# Patient Record
Sex: Male | Born: 1965 | Race: White | Hispanic: No | Marital: Married | Smoking: Never smoker
Health system: Southern US, Community
[De-identification: ages and names within clinical notes are randomized; demographics above are authoritative.]

## PROBLEM LIST (undated history)

## (undated) DIAGNOSIS — J302 Other seasonal allergic rhinitis: Secondary | ICD-10-CM

## (undated) DIAGNOSIS — J029 Acute pharyngitis, unspecified: Secondary | ICD-10-CM

## (undated) HISTORY — PX: CHOLECYSTECTOMY: SHX55

## (undated) HISTORY — DX: Acute pharyngitis, unspecified: J02.9

---

## 2001-01-09 ENCOUNTER — Encounter (INDEPENDENT_AMBULATORY_CARE_PROVIDER_SITE_OTHER): Payer: Self-pay | Admitting: Specialist

## 2001-01-09 ENCOUNTER — Ambulatory Visit (HOSPITAL_BASED_OUTPATIENT_CLINIC_OR_DEPARTMENT_OTHER): Admission: RE | Admit: 2001-01-09 | Discharge: 2001-01-09 | Payer: Self-pay | Admitting: Surgery

## 2008-12-18 ENCOUNTER — Emergency Department (HOSPITAL_BASED_OUTPATIENT_CLINIC_OR_DEPARTMENT_OTHER): Admission: EM | Admit: 2008-12-18 | Discharge: 2008-12-18 | Payer: Self-pay | Admitting: Emergency Medicine

## 2008-12-18 ENCOUNTER — Ambulatory Visit: Payer: Self-pay | Admitting: Diagnostic Radiology

## 2010-09-21 ENCOUNTER — Ambulatory Visit: Payer: Self-pay | Admitting: Emergency Medicine

## 2010-11-08 NOTE — Assessment & Plan Note (Signed)
Summary: URI/TJ Room 5   Vital Signs:  Patient Profile:   45 Years Old Male CC:      Runny nose,congestion, sinus pressure x 4 days Height:     71 inches Weight:      215 pounds O2 Sat:      98 % O2 treatment:    Room Air Temp:     98.0 degrees F oral Pulse rate:   87 / minute Pulse rhythm:   regular Resp:     18 per minute BP sitting:   115 / 80  (left arm) Cuff size:   regular  Vitals Entered By: Emilio Math (September 21, 2010 12:09 PM)                  Current Allergies: No known allergies History of Present Illness History from: patient Chief Complaint: Runny nose,congestion, sinus pressure x 4 days History of Present Illness: Patient complains of onset of cold symptoms for 4 days.  They have been using Advil which is helping a little bit. No sore throat + cough No pleuritic pain No wheezing No nasal congestion + post-nasal drainage + sinus pain/pressure No chest congestion No itchy/red eyes No earache No hemoptysis No SOB No chills/sweats No fever No nausea No vomiting No abdominal pain No diarrhea No skin rashes No fatigue No myalgias No headache   REVIEW OF SYSTEMS Constitutional Symptoms      Denies fever, chills, night sweats, weight loss, weight gain, and fatigue.  Eyes       Denies change in vision, eye pain, eye discharge, glasses, contact lenses, and eye surgery. Ear/Nose/Throat/Mouth       Complains of frequent runny nose and sinus problems.      Denies hearing loss/aids, change in hearing, ear pain, ear discharge, dizziness, frequent nose bleeds, sore throat, hoarseness, and tooth pain or bleeding.  Respiratory       Complains of dry cough.      Denies productive cough, wheezing, shortness of breath, asthma, bronchitis, and emphysema/COPD.  Cardiovascular       Denies murmurs, chest pain, and tires easily with exhertion.    Gastrointestinal       Denies stomach pain, nausea/vomiting, diarrhea, constipation, blood in bowel movements,  and indigestion. Genitourniary       Denies painful urination, kidney stones, and loss of urinary control. Neurological       Denies paralysis, seizures, and fainting/blackouts. Musculoskeletal       Denies muscle pain, joint pain, joint stiffness, decreased range of motion, redness, swelling, muscle weakness, and gout.  Skin       Denies bruising, unusual mles/lumps or sores, and hair/skin or nail changes.  Psych       Denies mood changes, temper/anger issues, anxiety/stress, speech problems, depression, and sleep problems.  Past History:  Past Medical History: Unremarkable  Past Surgical History: Denies surgical history  Family History: Mother, Healthy Father, D  Social History: Non smoker ETOH yes No [employer] Insurance Physical Exam General appearance: well developed, well nourished, no acute distress Nasal: clear discharge Oral/Pharynx: pharyngeal erythema without exudate, uvula midline without deviation Neck: neck supple,  trachea midline, no masses Chest/Lungs: no rales, wheezes, or rhonchi bilateral, breath sounds equal without effort Heart: regular rate and  rhythm, no murmur Skin: no obvious rashes or lesions MSE: oriented to time, place, and person Assessment New Problems: UPPER RESPIRATORY INFECTION, ACUTE (ICD-465.9)   Patient Education: Patient and/or caregiver instructed in the following: rest, fluids.  Plan New  Medications/Changes: AMOXICILLIN 875 MG TABS (AMOXICILLIN) 1 by mouth two times a day for 7 days  #14 x 0, 09/21/2010, Hoyt Koch MD  New Orders: New Patient Level III [phone] Planning Comments:   1)  Take the prescribed antibiotic as instructed. (hold for a few days at least) 2)  Use nasal saline solution (over the counter) at least 3 times a day. 3)  Use over the counter decongestants like Zyrtec-D every 12 hours as needed to help with congestion. 4)  Can take tylenol every 6 hours or motrin every 8 hours for pain or fever. 5)   Follow up with your primary doctor  if no improvement in 5-7 days, sooner if increasing pain, fever, or new symptoms.     The patient and/or caregiver has been counseled thoroughly with regard to medications prescribed including dosage, schedule, interactions, rationale for use, and possible side effects and they verbalize understanding.  Diagnoses and expected course of recovery discussed and will return if not improved as expected or if the condition worsens. Patient and/or caregiver verbalized understanding.  Prescriptions: AMOXICILLIN 875 MG TABS (AMOXICILLIN) 1 by mouth two times a day for 7 days  #14 x 0   Entered and Authorized by:   Hoyt Koch MD   Signed by:   Hoyt Koch MD on 09/21/2010   Method used:   Print then Give to Patient   RxID:   [id number]   Orders Added: 1)  New Patient Level III [13086]

## 2010-11-22 ENCOUNTER — Other Ambulatory Visit (HOSPITAL_BASED_OUTPATIENT_CLINIC_OR_DEPARTMENT_OTHER): Payer: Self-pay | Admitting: Family Medicine

## 2010-11-22 ENCOUNTER — Ambulatory Visit (HOSPITAL_BASED_OUTPATIENT_CLINIC_OR_DEPARTMENT_OTHER)
Admission: RE | Admit: 2010-11-22 | Discharge: 2010-11-22 | Disposition: A | Discharge status: Home / Self Care | Payer: 59 | Source: Ambulatory Visit | Attending: Family Medicine | Admitting: Family Medicine

## 2010-11-22 DIAGNOSIS — R109 Unspecified abdominal pain: Secondary | ICD-10-CM

## 2010-11-22 MED ORDER — IOHEXOL 300 MG/ML  SOLN
100.0000 mL | Freq: Once | INTRAMUSCULAR | Status: AC | PRN
  Administered 2010-11-22: 100 mL via INTRAVENOUS

## 2010-11-27 ENCOUNTER — Other Ambulatory Visit (HOSPITAL_BASED_OUTPATIENT_CLINIC_OR_DEPARTMENT_OTHER): Payer: Self-pay | Admitting: Family Medicine

## 2010-11-27 DIAGNOSIS — R1011 Right upper quadrant pain: Secondary | ICD-10-CM

## 2010-11-29 ENCOUNTER — Other Ambulatory Visit (HOSPITAL_BASED_OUTPATIENT_CLINIC_OR_DEPARTMENT_OTHER): Payer: 59

## 2010-11-30 ENCOUNTER — Ambulatory Visit (HOSPITAL_BASED_OUTPATIENT_CLINIC_OR_DEPARTMENT_OTHER)
Admission: RE | Admit: 2010-11-30 | Discharge: 2010-11-30 | Disposition: A | Discharge status: Home / Self Care | Payer: 59 | Source: Ambulatory Visit | Attending: Family Medicine | Admitting: Family Medicine

## 2010-11-30 DIAGNOSIS — R1084 Generalized abdominal pain: Secondary | ICD-10-CM | POA: Insufficient documentation

## 2010-11-30 DIAGNOSIS — K7689 Other specified diseases of liver: Secondary | ICD-10-CM | POA: Insufficient documentation

## 2010-11-30 DIAGNOSIS — K8020 Calculus of gallbladder without cholecystitis without obstruction: Secondary | ICD-10-CM | POA: Insufficient documentation

## 2010-11-30 DIAGNOSIS — R1011 Right upper quadrant pain: Secondary | ICD-10-CM

## 2011-01-01 ENCOUNTER — Encounter (HOSPITAL_COMMUNITY)
Admission: RE | Admit: 2011-01-01 | Discharge: 2011-01-01 | Disposition: A | Discharge status: Home / Self Care | Payer: 59 | Source: Ambulatory Visit | Attending: Surgery | Admitting: Surgery

## 2011-01-01 LAB — CBC
HCT: 48.4 % (ref 39.0–52.0)
MCHC: 36.6 g/dL — ABNORMAL HIGH (ref 30.0–36.0)
MCV: 85.2 fL (ref 78.0–100.0)
Platelets: 138 10*3/uL — ABNORMAL LOW (ref 150–400)
RDW: 12.7 % (ref 11.5–15.5)

## 2011-01-07 ENCOUNTER — Ambulatory Visit (HOSPITAL_COMMUNITY): Payer: 59

## 2011-01-07 ENCOUNTER — Ambulatory Visit (HOSPITAL_COMMUNITY)
Admission: RE | Admit: 2011-01-07 | Discharge: 2011-01-07 | Disposition: A | Discharge status: Home / Self Care | Payer: 59 | Source: Ambulatory Visit | Attending: Surgery | Admitting: Surgery

## 2011-01-07 ENCOUNTER — Other Ambulatory Visit: Payer: Self-pay | Admitting: Surgery

## 2011-01-07 DIAGNOSIS — Z01812 Encounter for preprocedural laboratory examination: Secondary | ICD-10-CM | POA: Insufficient documentation

## 2011-01-07 DIAGNOSIS — K8010 Calculus of gallbladder with chronic cholecystitis without obstruction: Secondary | ICD-10-CM | POA: Insufficient documentation

## 2011-01-08 NOTE — Op Note (Signed)
NAMERJAY, REVOLORIO                ACCOUNT NO.:  [id number]  MEDICAL RECORD NO.:  [id number]           PATIENT TYPE:  O  LOCATION:  [facility]                         FACILITY:  [facility]  PHYSICIAN:  Abigail Miyamoto, M.D. DATE OF BIRTH:  August 15, 1966  DATE OF PROCEDURE:  01/07/2011 DATE OF DISCHARGE:  01/07/2011                              OPERATIVE REPORT   PREOPERATIVE DIAGNOSIS:  Symptomatic cholelithiasis.  POSTOPERATIVE DIAGNOSIS:  Symptomatic cholelithiasis.  PROCEDURE:  Laparoscopic cholecystectomy with intraoperative cholangiogram.  SURGEON:  Abigail Miyamoto, MD  ANESTHESIA:  General with 0.5% Marcaine.  ASSISTANT:  Magnus Ivan, RNFA  ESTIMATED BLOOD LOSS:  Minimal.  FINDINGS:  The patient was found to have a chronically scarred-appearing gallbladder with gallstones.  Cholangiogram was normal.  PROCEDURE IN DETAIL:  The patient was brought to the operative room, identified as Keith Bradford.  He was placed supine on the operative room table and general anesthesia was induced.  His abdomen was then prepped and draped in the usual sterile fashion.  Using a #15 blade, a small transverse incision was made across the lower edge of the umbilicus. This was carried down to the fascia which was then opened with a scalpel.  A hemostat was then used to pass the peritoneal cavity under direct vision.  Next, a 0 Vicryl purse-string suture was placed around the fascial opening.  The stump was placed in the opening and insufflation of the abdomen was begun.  A 5-mm port was then placed in the patient's epigastrium and 2 more in the right upper quadrant all under direct vision.  Gallbladder was then grasped and retracted above the liver bed.  Several adhesions to the gallbladder were then taken down.  The cystic duct was easily dissected out and critical window was achieved around it as well as a bridging lymphatic and the cystic artery.  I clipped the cystic duct once distally, I then  clipped the bridging lymphatic and the cystic artery proximally and distally.  I then made a small opening in the cystic duct with laparoscopic scissors. I made a small incision in the right upper quadrant and placed a cholangiocatheter through this incision under direct vision. Cholangiocatheter was then placed in the cystic duct.  I then performed a cholangiogram with contrast under direct fluoroscopy.  Contrast was seen to flow into the entire biliary system and duodenum without evidence of obstruction.  I then completely removed the cholangiocatheter.  The cystic duct was then clipped 3 times proximally and completely transected along with the lymphatic and the artery.  The gallbladder was then slowly dissected free from liver bed with electrocautery.  Once this freed from liver bed, it was removed through the incision at the umbilicus.  I then thoroughly irrigated the abdomen with normal saline.  A 0 Vicryl at the umbilicus was tied in place closing the fascial defect.  Hemostasis appeared to be achieved in the liver bed.  All ports were removed under direct vision.  The abdomen was deflated.  All incisions were anesthetized with Marcaine and closed with 4-0 Monocryl subcuticular sutures.  Steri-Strips and Band-Aids were then applied.  The patient  tolerated the procedure well. All counts were correct at the end of procedure.  The patient was then extubated in the operating room and taken in stable condition to recovery room.     Abigail Miyamoto, M.D.     DB/MEDQ  D:  01/07/2011  T:  01/07/2011  Job:  [id number]  Electronically Signed by Abigail Miyamoto M.D. on 01/08/2011 10:44:22 AM

## 2011-02-06 ENCOUNTER — Telehealth (INDEPENDENT_AMBULATORY_CARE_PROVIDER_SITE_OTHER): Payer: Self-pay | Admitting: Emergency Medicine

## 2011-02-06 ENCOUNTER — Encounter: Payer: Self-pay | Admitting: Emergency Medicine

## 2011-02-06 ENCOUNTER — Inpatient Hospital Stay (INDEPENDENT_AMBULATORY_CARE_PROVIDER_SITE_OTHER)
Admission: RE | Admit: 2011-02-06 | Discharge: 2011-02-06 | Disposition: A | Discharge status: Home / Self Care | Payer: 59 | Source: Ambulatory Visit | Attending: Emergency Medicine | Admitting: Emergency Medicine

## 2011-02-06 DIAGNOSIS — J019 Acute sinusitis, unspecified: Secondary | ICD-10-CM

## 2011-02-28 ENCOUNTER — Encounter: Payer: Self-pay | Admitting: Family Medicine

## 2011-02-28 ENCOUNTER — Ambulatory Visit
Admission: RE | Admit: 2011-02-28 | Discharge: 2011-02-28 | Disposition: A | Discharge status: Home / Self Care | Payer: 59 | Source: Ambulatory Visit | Attending: Family Medicine | Admitting: Family Medicine

## 2011-02-28 ENCOUNTER — Other Ambulatory Visit: Payer: Self-pay | Admitting: Family Medicine

## 2011-02-28 ENCOUNTER — Inpatient Hospital Stay (INDEPENDENT_AMBULATORY_CARE_PROVIDER_SITE_OTHER)
Admission: RE | Admit: 2011-02-28 | Discharge: 2011-02-28 | Disposition: A | Discharge status: Home / Self Care | Payer: 59 | Source: Ambulatory Visit | Attending: Family Medicine | Admitting: Family Medicine

## 2011-02-28 DIAGNOSIS — R0789 Other chest pain: Secondary | ICD-10-CM

## 2011-02-28 DIAGNOSIS — M940 Chondrocostal junction syndrome [Tietze]: Secondary | ICD-10-CM

## 2011-02-28 DIAGNOSIS — J309 Allergic rhinitis, unspecified: Secondary | ICD-10-CM | POA: Insufficient documentation

## 2011-03-01 ENCOUNTER — Encounter: Payer: Self-pay | Admitting: Family Medicine

## 2011-05-24 ENCOUNTER — Encounter: Payer: Self-pay | Admitting: Emergency Medicine

## 2011-05-24 ENCOUNTER — Inpatient Hospital Stay (INDEPENDENT_AMBULATORY_CARE_PROVIDER_SITE_OTHER)
Admission: RE | Admit: 2011-05-24 | Discharge: 2011-05-24 | Disposition: A | Discharge status: Home / Self Care | Payer: 59 | Source: Ambulatory Visit | Attending: Emergency Medicine | Admitting: Emergency Medicine

## 2011-05-24 DIAGNOSIS — J029 Acute pharyngitis, unspecified: Secondary | ICD-10-CM

## 2011-05-27 ENCOUNTER — Telehealth (INDEPENDENT_AMBULATORY_CARE_PROVIDER_SITE_OTHER): Payer: Self-pay | Admitting: *Deleted

## 2011-09-09 NOTE — Progress Notes (Signed)
Summary: CHEST PAIN (rm 2)   Vital Signs:  Patient Profile:   45 Years Old Male CC:      Chest Pain x 3 days Height:     71 inches Weight:      224 pounds O2 Sat:      96 % O2 treatment:    Room Air Temp:     98.6 degrees F oral Pulse rate:   89 / minute Resp:     16 per minute BP sitting:   132 / 91  (left arm) Cuff size:   regular  Pt. in pain?   yes    Location:   center chest    Intensity:   5    Type:       tight  Vitals Entered By: Lajean Saver RN (Feb 28, 2011 10:27 AM)                   Updated Prior Medication List: ADVIL 200 MG TABS (IBUPROFEN) 3 prn ALLEGRA ALLERGY 180 MG TABS (FEXOFENADINE HCL)   Current Allergies: No known allergies History of Present Illness Chief Complaint: Chest Pain x 3 days History of Present Illness:  Subjective:  Patient complains of 4 day history of constant vague discomfort over anterior chest and sternum, somewhat worse with movement.  The pain does not radiate.  No cough or shortness of breath.  No reflux symptoms.  No recent trauma to chest or change in physical activities.  No fevers, chills, and sweats.  He reports that he had a respiratory illness two weeks ago, resolved.  No family history of cardiovascular disease.  REVIEW OF SYSTEMS Constitutional Symptoms      Denies fever, chills, night sweats, weight loss, weight gain, and fatigue.  Eyes       Denies change in vision, eye pain, eye discharge, glasses, contact lenses, and eye surgery. Ear/Nose/Throat/Mouth       Denies hearing loss/aids, change in hearing, ear pain, ear discharge, dizziness, frequent runny nose, frequent nose bleeds, sinus problems, sore throat, hoarseness, and tooth pain or bleeding.  Respiratory       Denies dry cough, productive cough, wheezing, shortness of breath, asthma, bronchitis, and emphysema/COPD.  Cardiovascular       Complains of chest pain.      Denies murmurs and tires easily with exhertion.      Comments: "tight"    Gastrointestinal       Denies stomach pain, nausea/vomiting, diarrhea, constipation, blood in bowel movements, and indigestion. Genitourniary       Denies painful urination, blood or discharge from penis, kidney stones, and loss of urinary control. Neurological       Denies paralysis, seizures, and fainting/blackouts. Musculoskeletal       Denies muscle pain, joint pain, joint stiffness, decreased range of motion, redness, swelling, muscle weakness, and gout.  Skin       Denies bruising, unusual mles/lumps or sores, and hair/skin or nail changes.  Psych       Denies mood changes, temper/anger issues, anxiety/stress, speech problems, depression, and sleep problems. Other Comments: Patient c/o "tight" CP located in center of chest constant x 3 days. No SOB, nausea, dizziness, numbness.    Past History:  Past Medical History:  Allergic rhinitis  Past Surgical History: Reviewed history from 09/21/2010 and no changes required. Denies surgical history  Social History: Non smoker ETOH 2-3/month yes Drug use-no Married Drug Use:  no   Objective:  Appearance:  Patient appears healthy, stated age,  and in no acute distress  Eyes:  Pupils are equal, round, and reactive to light and accomodation.  Extraocular movement is intact.  Conjunctivae are not inflamed.  Ears:  Canals normal.  Tympanic membranes normal.   Nose:  Mildly congested turbinates.  No sinus tenderness  Pharynx:  Normal  Neck:  Supple.  No adenopathy is present.  No thyromegaly is present  Lungs:  Clear to auscultation.  Breath sounds are equal.  Chest:  Tenderness over sternum with resisted flexion of pectoralis muscles bilaterally Heart:  Regular rate and rhythm without murmurs, rubs, or gallops.  Abdomen:  Nontender without masses or hepatosplenomegaly.  Bowel sounds are present.  No CVA or flank tenderness.  Extremities:  No edema.   Skin:  No rash  EKG:  normal Chest X-ray:  negative Assessment New  Problems: COSTOCHONDRITIS, ACUTE (ICD-733.6) CHEST PAIN, NON-CARDIAC (ICD-786.59) ALLERGIC RHINITIS (ICD-477.9)  SUSPECT COSTOCHONDRITIS SECONDARY TO RECENT RESPIRATORY ILLNESS  Plan New Orders: T-Chest x-ray, 2 views [71020] EKG w/ Interpretation [93000] Pulse Oximetry (single measurment) [94760] Est. Patient Level IV [16109] Planning Comments:   Begin Ibuprofen 200mg , 4 tabs every 8 hours with food.  Apply heating pad to chest twice daily.  Given a Water quality scientist patient information and instruction sheet on topic  Followup with [facility] if not improved in two weeks.   The patient and/or caregiver has been counseled thoroughly with regard to medications prescribed including dosage, schedule, interactions, rationale for use, and possible side effects and they verbalize understanding.  Diagnoses and expected course of recovery discussed and will return if not improved as expected or if the condition worsens. Patient and/or caregiver verbalized understanding.   Orders Added: 1)  T-Chest x-ray, 2 views [71020] 2)  EKG w/ Interpretation [93000] 3)  Pulse Oximetry (single measurment) [94760] 4)  Est. Patient Level IV [60454]

## 2011-09-09 NOTE — Telephone Encounter (Signed)
  Phone Note Outgoing Call   Call placed by: Clemens Catholic LPN,  May 27, 2011 5:12 PM Call placed to: Patient Summary of Call: call back: left message with throat culture results and to call back if he has any questions or concerns. Initial call taken by: Clemens Catholic LPN,  May 27, 2011 5:13 PM

## 2011-09-09 NOTE — Progress Notes (Signed)
Summary: ?cold/TM   Vital Signs:  Patient Profile:   45 Years Old Male CC:      Cold & URI symptoms Height:     71 inches Weight:      228 pounds O2 Sat:      97 % O2 treatment:    Room Air Temp:     100.2 degrees F oral Pulse rate:   95 / minute Resp:     20 per minute BP sitting:   101 / 68  (left arm) Cuff size:   regular  Vitals Entered By: Clemens Catholic LPN (May 24, 2011 4:18 PM)                  Updated Prior Medication List: No Medications Current Allergies (reviewed today): No known allergies History of Present Illness History from: patient Chief Complaint: Cold & URI symptoms History of Present Illness: 45 Years Old Male complains of onset of cold symptoms for 2 days.  His wife was also sick.  He just got home from a cruise, so sick contacts probable. + sore throat +/- cough No pleuritic pain No wheezing + nasal congestion + post-nasal drainage + sinus pain/pressure No chest congestion No itchy/red eyes No earache No hemoptysis No SOB No chills/sweats + fever No nausea No vomiting No abdominal pain No diarrhea No skin rashes + fatigue + myalgias + headache   REVIEW OF SYSTEMS Constitutional Symptoms       Complains of fever, chills, and night sweats.     Denies weight loss, weight gain, and fatigue.  Eyes       Denies change in vision, eye pain, eye discharge, glasses, contact lenses, and eye surgery. Ear/Nose/Throat/Mouth       Complains of frequent runny nose, sinus problems, sore throat, and hoarseness.      Denies hearing loss/aids, change in hearing, ear pain, ear discharge, dizziness, frequent nose bleeds, and tooth pain or bleeding.  Respiratory       Complains of dry cough.      Denies productive cough, wheezing, shortness of breath, asthma, bronchitis, and emphysema/COPD.  Cardiovascular       Denies murmurs, chest pain, and tires easily with exhertion.    Gastrointestinal       Denies stomach pain, nausea/vomiting, diarrhea,  constipation, blood in bowel movements, and indigestion. Genitourniary       Denies painful urination, kidney stones, and loss of urinary control. Neurological       Complains of headaches.      Denies paralysis, seizures, and fainting/blackouts. Musculoskeletal       Complains of muscle pain and joint pain.      Denies joint stiffness, decreased range of motion, redness, swelling, muscle weakness, and gout.  Skin       Denies bruising, unusual mles/lumps or sores, and hair/skin or nail changes.  Psych       Denies mood changes, temper/anger issues, anxiety/stress, speech problems, depression, and sleep problems. Other Comments: pt c/o head congestion, hoarseness, fever, and sore throat x 2 days. he has taken advil and decongestant.    Past History:  Past Medical History: Reviewed history from 02/28/2011 and no changes required.  Allergic rhinitis  Past Surgical History:  Cholecystectomy  Family History: Reviewed history from 09/21/2010 and no changes required. Mother, Healthy Father, D  Social History: Reviewed history from 02/28/2011 and no changes required. Non smoker ETOH 2-3/month yes Drug use-no Married Physical Exam General appearance: well developed, well nourished, no acute distress,  feels warm Ears: cerumen bilaterally Nasal: clear discharge Oral/Pharynx: pharyngeal erythema without exudate, uvula midline without deviation Neck: ant cerv LAD nontender Chest/Lungs: no rales, wheezes, or rhonchi bilateral, breath sounds equal without effort Heart: regular rate and  rhythm, no murmur MSE: oriented to time, place, and person Assessment New Problems: ACUTE PHARYNGITIS (ICD-462)   Plan New Medications/Changes: PREDNISONE 20 MG TABS (PREDNISONE) two times a day for 3 days  #6 x 0, 05/24/2011, Hoyt Koch MD AMOXICILLIN 875 MG TABS (AMOXICILLIN) 1 by mouth two times a day for 7 days  #14 x 0, 05/24/2011, Hoyt Koch MD  New Orders: Est. Patient  Level III [14782] Rapid Strep [95621] T-Culture, Throat [30865-78469] Planning Comments:   1)  Take the prescribed antibiotic as instructed.  Rx for pred x3d for the symptoms.  Rapid strep neg, but culture is pending. 2)  Use nasal saline solution (over the counter) at least 3 times a day. 3)  Use over the counter decongestants like Zyrtec-D every 12 hours as needed to help with congestion. 4)  Can take tylenol every 6 hours or motrin every 8 hours for pain or fever. 5)  Follow up with your primary doctor  if no improvement in 5-7 days, sooner if increasing pain, fever, or new symptoms.    The patient and/or caregiver has been counseled thoroughly with regard to medications prescribed including dosage, schedule, interactions, rationale for use, and possible side effects and they verbalize understanding.  Diagnoses and expected course of recovery discussed and will return if not improved as expected or if the condition worsens. Patient and/or caregiver verbalized understanding.  Prescriptions: PREDNISONE 20 MG TABS (PREDNISONE) two times a day for 3 days  #6 x 0   Entered and Authorized by:   Hoyt Koch MD   Signed by:   Hoyt Koch MD on 05/24/2011   Method used:   Print then Give to Patient   RxID:   [id number] AMOXICILLIN 875 MG TABS (AMOXICILLIN) 1 by mouth two times a day for 7 days  #14 x 0   Entered and Authorized by:   Hoyt Koch MD   Signed by:   Hoyt Koch MD on 05/24/2011   Method used:   Print then Give to Patient   RxID:   [id number]   Orders Added: 1)  Est. Patient Level III [56387] 2)  Rapid Strep [56433] 3)  T-Culture, Throat [[id number]]    Laboratory Results  Date/Time Received: May 24, 2011 5:11 PM  Date/Time Reported: May 24, 2011 5:11 PM   Other Tests  Rapid Strep: negative  Kit Test Internal QC: Negative   (Normal Range: Negative)

## 2011-09-09 NOTE — Telephone Encounter (Signed)
  Phone Note Outgoing Call   Call placed by: Emilio Math,  Feb 06, 2011 5:04 PM Call placed to: Pharmacy Summary of Call: Called in Bactrim DS 1 tab by mouth two times a day x10 days #20 no refills to [facility]. Emilio Math  Feb 06, 2011 5:06 PM

## 2011-09-09 NOTE — Progress Notes (Signed)
Summary: SINUS ISSUES Room 5   Vital Signs:  Patient Profile:   45 Years Old Male CC:      Ears hurt, throat hurts, pressure x 2 days Height:     71 inches O2 Sat:      99 % O2 treatment:    Room Air Temp:     98.6 degrees F oral Pulse rate:   100 / minute Pulse rhythm:   regular Resp:     16 per minute BP sitting:   120 / 79  (left arm) Cuff size:   regular  Vitals Entered By: Emilio Math (Feb 06, 2011 4:48 PM)                  Current Allergies: No known allergies History of Present Illness History from: patient Chief Complaint: Ears hurt, throat hurts, pressure x 2 days History of Present Illness: 45 Years Old Male complains of onset of cold symptoms for 2-3 days.  Keith Bradford has been using Allegra, Singulair, and Sudafed which is helping a little bit. No sore throat + cough No pleuritic pain No wheezing + nasal congestion + post-nasal drainage + sinus pain/pressure No chest congestion No itchy/red eyes No earache No hemoptysis No SOB No chills/sweats No fever No nausea No vomiting No abdominal pain No diarrhea No skin rashes No fatigue No myalgias + headache   Current Meds ADVIL 200 MG TABS (IBUPROFEN) 3 prn ALLEGRA ALLERGY 180 MG TABS (FEXOFENADINE HCL)  SINGULAIR 10 MG TABS (MONTELUKAST SODIUM)  BACTRIM DS 800-160 MG TABS (SULFAMETHOXAZOLE-TRIMETHOPRIM) 1 by mouth two times a day for 10 days  REVIEW OF SYSTEMS  Eyes       Complains of eye drainage. Ear/Nose/Throat/Mouth       Complains of sinus problems and sore throat.      Neurological       Complains of headaches.    Past History:  Past Medical History: Reviewed history from 09/21/2010 and no changes required. Unremarkable  Past Surgical History: Reviewed history from 09/21/2010 and no changes required. Denies surgical history  Social History: Reviewed history from 09/21/2010 and no changes required. Non smoker ETOH yes No [employer] Insurance Physical Exam General appearance:  well developed, well nourished, no acute distress Ears: normal, no lesions or deformities Nasal: mucosa pink, nonedematous, no septal deviation, turbinates normal Oral/Pharynx: tongue normal, posterior pharynx without erythema or exudate Chest/Lungs: no rales, wheezes, or rhonchi bilateral, breath sounds equal without effort Heart: regular rate and  rhythm, no murmur MSE: oriented to time, place, and person Assessment New Problems: SINUSITIS, ACUTE (ICD-461.9)   Plan New Medications/Changes: BACTRIM DS 800-160 MG TABS (SULFAMETHOXAZOLE-TRIMETHOPRIM) 1 by mouth two times a day for 10 days  #20 x 0, 02/06/2011, Hoyt Koch MD  New Orders: Pulse Oximetry (single measurment) [94760] Est. Patient Level IV [13086] Planning Comments:   1)  Take the prescribed antibiotic as instructed.   2)  Use nasal saline solution (over the counter) at least 3 times a day. 3)  Use over the counter decongestants like Zyrtec-D every 12 hours as needed to help with congestion. 4)  Can take tylenol every 6 hours or motrin every 8 hours for pain or fever. 5)  Follow up with your primary doctor  if no improvement in 5-7 days, sooner if increasing pain, fever, or new symptoms.    The patient and/or caregiver has been counseled thoroughly with regard to medications prescribed including dosage, schedule, interactions, rationale for use, and possible side effects and they  verbalize understanding.  Diagnoses and expected course of recovery discussed and will return if not improved as expected or if the condition worsens. Patient and/or caregiver verbalized understanding.  Prescriptions: BACTRIM DS 800-160 MG TABS (SULFAMETHOXAZOLE-TRIMETHOPRIM) 1 by mouth two times a day for 10 days  #20 x 0   Entered and Authorized by:   Hoyt Koch MD   Signed by:   Hoyt Koch MD on 02/06/2011   Method used:   Telephoned to ...       [facility]  [address] [phone][facility] (retail)       [address] [place], [place]  [address]       Ph: [phone] or [phone]       Fax: [phone]   RxID:   [id number]   Orders Added: 1)  Pulse Oximetry (single measurment) [94760] 2)  Est. Patient Level IV [36644]

## 2011-10-28 IMAGING — CT CT ABD-PELV W/ CM
2 of 5 series · 16 of 46 positions shown, 18 images · IV contrast (APPLIED)
Comparison: None.

CLINICAL DATA: Generalized intermittent and abdominal pain for 1
year. No history of surgery, injury or cancer.

CT ABDOMEN AND PELVIS WITH CONTRAST
TECHNIQUE: Multidetector CT imaging of the abdomen and pelvis was
performed following the standard protocol during bolus
administration of intravenous contrast.
Contrast: 100 ml of Omnipaque 300% and oral contrast

[Series 3: abd/pelvis 1.5 b31f thins · axial · 0.75mm/px · z∈[-514,-44]mm · 13 of 728 slices shown, 15 images]
[im 28/728  soft-tissue]
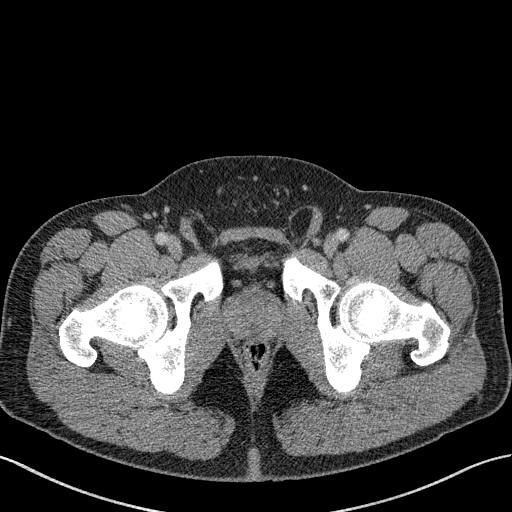
[im 28/728  bone]
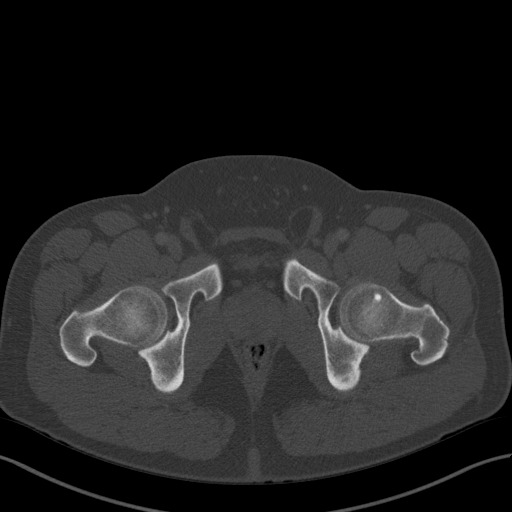
[im 84/728  soft-tissue]
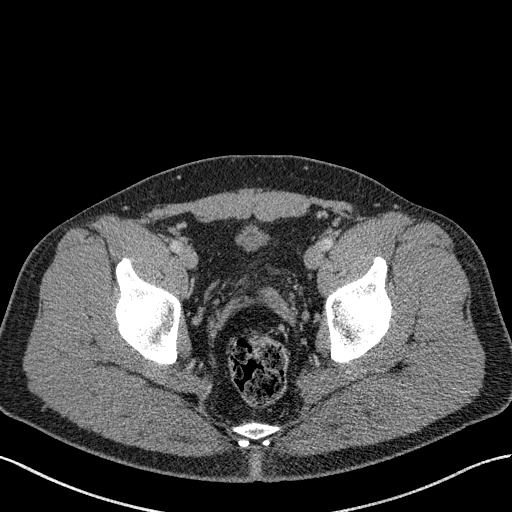
[im 140/728  soft-tissue]
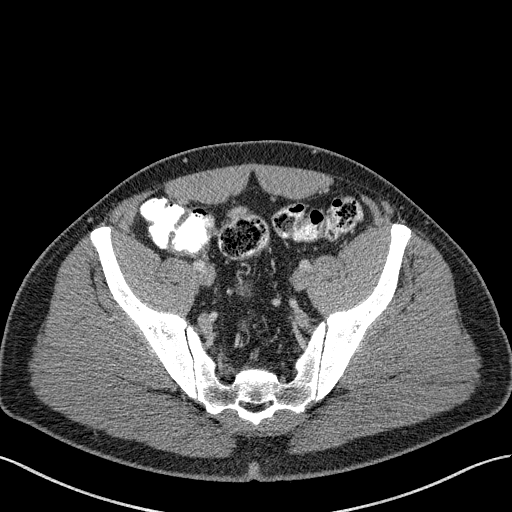
[im 196/728  soft-tissue]
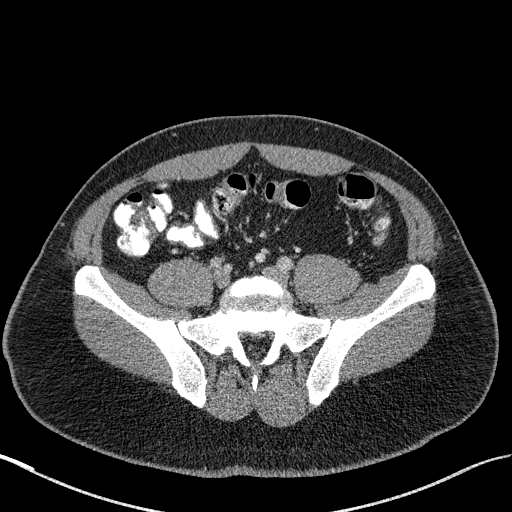
[im 252/728  soft-tissue]
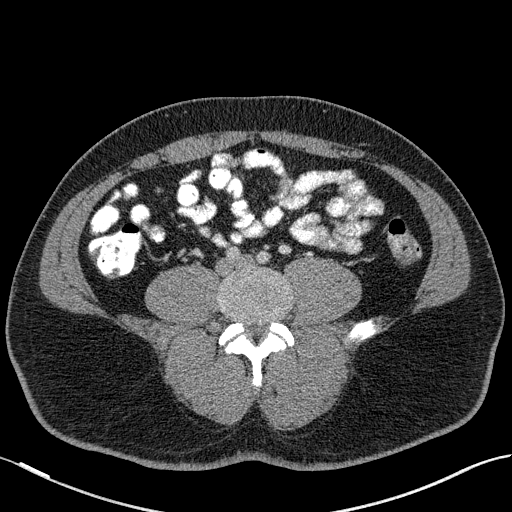
[im 308/728  soft-tissue]
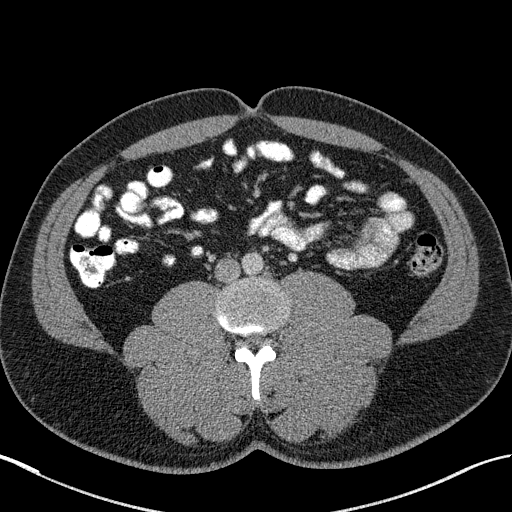
[im 364/728  soft-tissue]
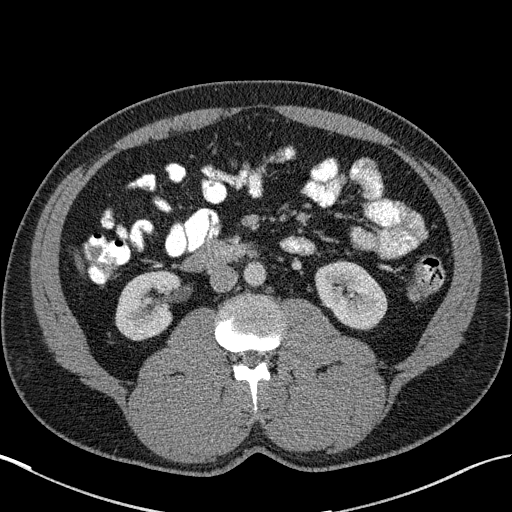
[im 420/728  soft-tissue]
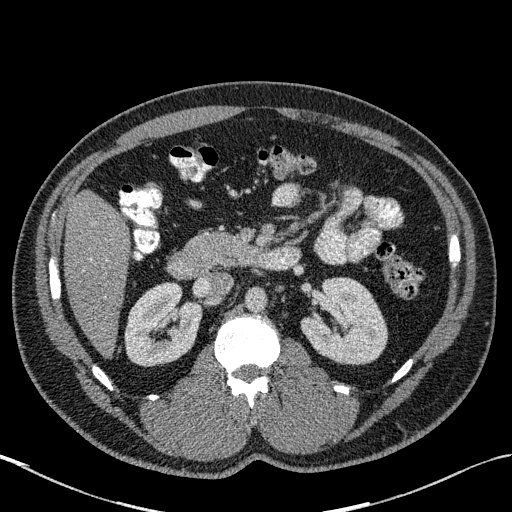
[im 476/728  soft-tissue]
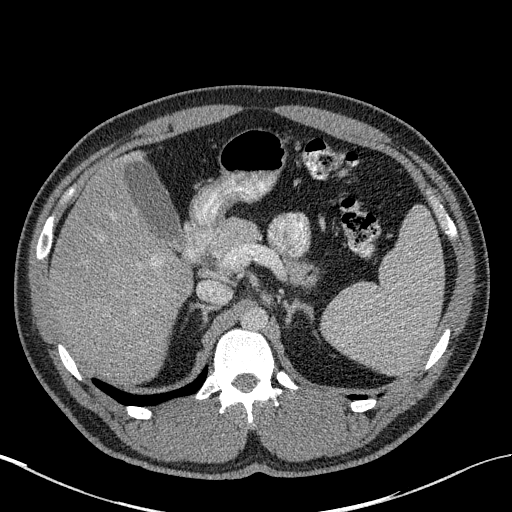
[im 476/728  bone]
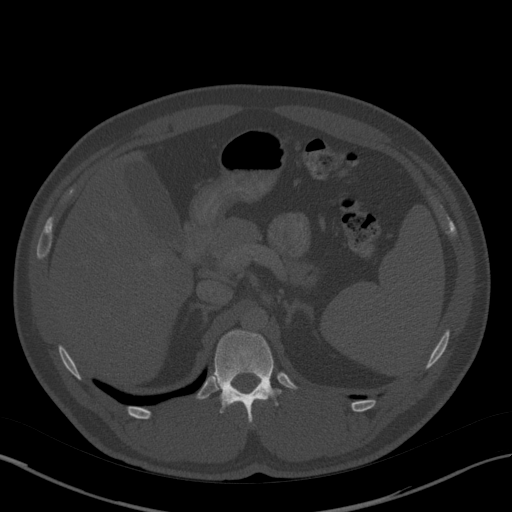
[im 532/728  soft-tissue]
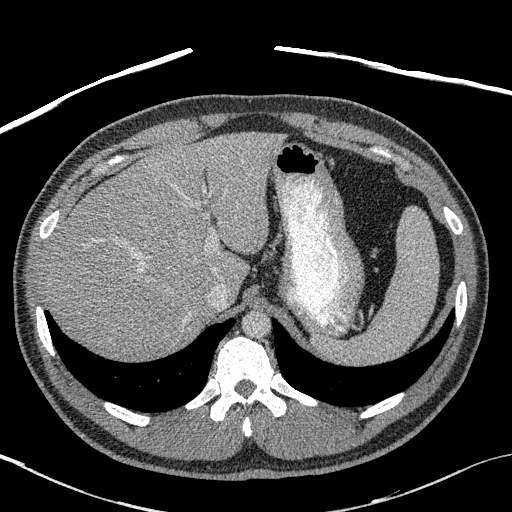
[im 588/728  soft-tissue]
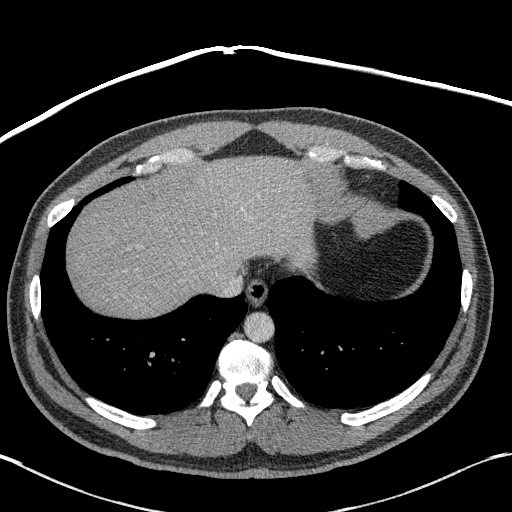
[im 644/728  soft-tissue]
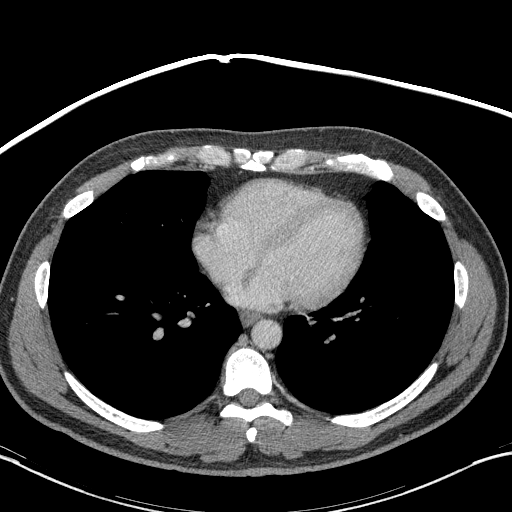
[im 700/728  soft-tissue]
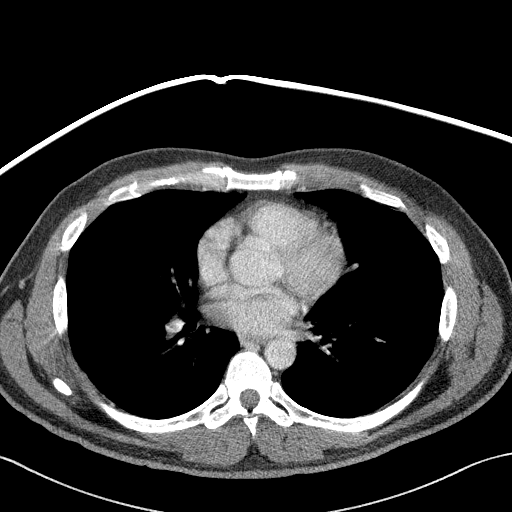

[Series 5: abd/pelvis 3.0 coronal · coronal · 0.77mm/px · 3 of 100 slices shown]
[im 34/100  soft-tissue]
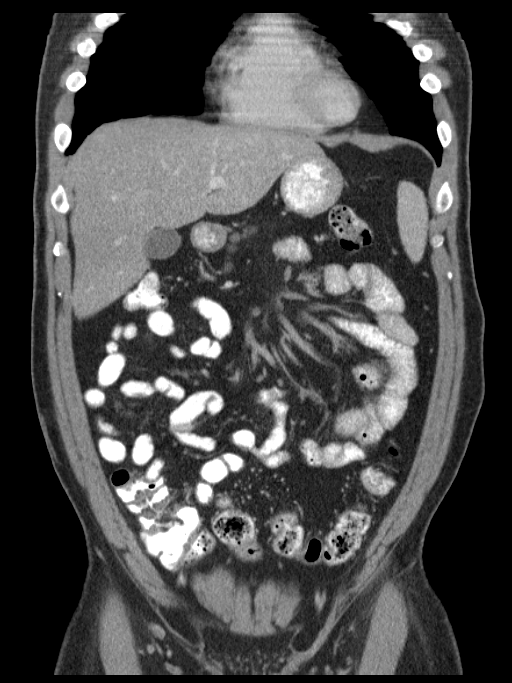
[im 45/100  soft-tissue]
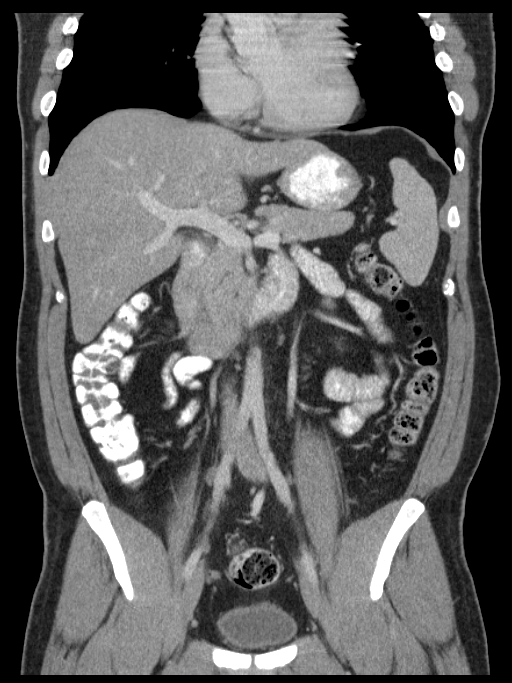
[im 56/100  soft-tissue]
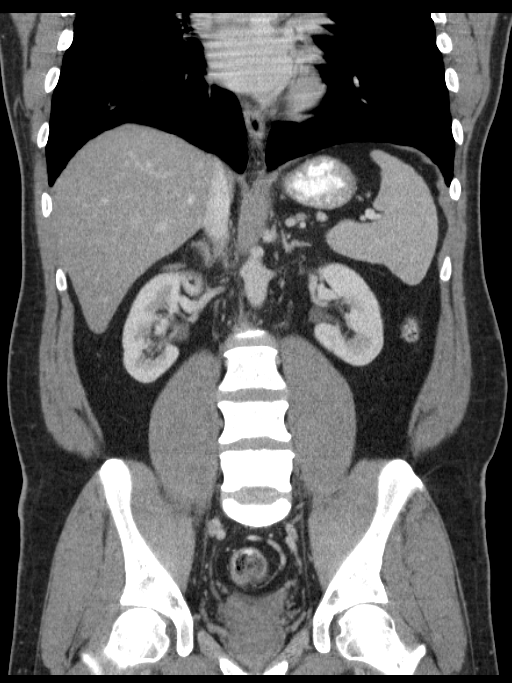

[16 of 46 positions shown; findings below may reference images not displayed]

FINDINGS: The lung bases appear clear.

The liver, spleen, adrenal glands and pancreas all have an
unremarkable post contrasted appearance.  There is a 9 mm high
attenuation focus identified in the gallbladder neck which may be
the result of volume averaging from the takeoff of the gallbladder
and common bile duct but as it is seen in several planes it is
suspicious for a gallstone.

Good bowel contrast was achieved.  No focal bowel abnormalities are
identified.  The ileocecal valve and appendix have a normal
appearance.

The prostate, vas deferens and bladder appear within normal limits.
No intra-abdominal or pelvic fluid or inflammatory change is seen.
No pathologic adenopathy is noted.

Bone windows demonstrate mild degenerative changes of the lower
thoracic spine.  No worrisome bone lesions are seen.

Vascular structures are unremarkable.  Small bilateral fat
containing inguinal hernias are seen.  Extraabdominal soft tissue
planes are otherwise maintained.
IMPRESSION: Question gallstone in the gallbladder neck.  This can be further
evaluated with ultrasound.

Small bilateral fat containing inguinal hernias.

Otherwise unremarkable abdominal pelvic CT.

## 2011-11-11 ENCOUNTER — Encounter: Payer: Self-pay | Admitting: *Deleted

## 2011-11-11 ENCOUNTER — Emergency Department
Admission: EM | Admit: 2011-11-11 | Discharge: 2011-11-11 | Disposition: A | Discharge status: Home / Self Care | Payer: Self-pay | Source: Home / Self Care | Attending: Emergency Medicine | Admitting: Emergency Medicine

## 2011-11-11 DIAGNOSIS — H5789 Other specified disorders of eye and adnexa: Secondary | ICD-10-CM

## 2011-11-11 MED ORDER — POLYMYXIN B-TRIMETHOPRIM 10000-0.1 UNIT/ML-% OP SOLN: 1.0000 [drp] | Freq: Four times a day (QID) | OPHTHALMIC | Status: AC

## 2011-11-11 NOTE — ED Notes (Signed)
Pt c/o RT eye lid swelling x 3 days, no injury. Denies vision changes. No OTC meds.

## 2011-11-11 NOTE — ED Provider Notes (Signed)
History     CSN: [id number]  Arrival date & time 11/11/11  4098   First MD Initiated Contact with Patient 11/11/11 [phone]      No chief complaint on file.   (Consider location/radiation/quality/duration/timing/severity/associated sxs/prior treatment) HPI Keith Bradford presents today with an EYE COMPLAINT.  Swelling over his R eye.  No actual eye involvement.  Mild, not affecting vision.    Location: right  Onset: 3  Days   Symptoms: Redness: no Discharge: no Pain: no Photophobia: no Decreased Vision: no URI symptoms: no Itching/Allergy sxs: yes Glaucoma: no Recent eye surgery: no Contact lens use: no  Red Flags Trauma: no Foreign Body: no Vomiting/HA: no Halos around lights: no Chickenpox or zoster: no    No past medical history on file.  No past surgical history on file.  No family history on file.  History  Substance Use Topics  . Smoking status: Not on file  . Smokeless tobacco: Not on file  . Alcohol Use: Not on file      Review of Systems  Allergies  Review of patient's allergies indicates not on file.  Home Medications  No current outpatient prescriptions on file.  There were no vitals taken for this visit.  Physical Exam  Eyes: Conjunctivae and EOM are normal. Pupils are equal, round, and reactive to light. No foreign bodies found. Right eye exhibits no discharge, no exudate and no hordeolum. No foreign body present in the right eye. No scleral icterus.      ED Course  Procedures (including critical care time)  Labs Reviewed - No data to display No results found.   No diagnosis found.    MDM   The differential diagnosis includes conjunctivitis, early stye, allergic reaction, backed up tear gland.  I gave him a prescription for Polytrim to use for the next week. I've also encouraged him to use some cold compresses in the morning time for swelling. I also let him know that warm compresses sometimes can help with a stye.  Lily Kocher, MD 11/11/11 [phone]

## 2012-02-03 IMAGING — CR DG CHEST 2V
2 series · 2 of 2 positions shown · non-contrast
Comparison: None

CLINICAL DATA: Chest pain, pain over sternum.

CHEST - 2 VIEW

[view not recorded (1 of 2)]
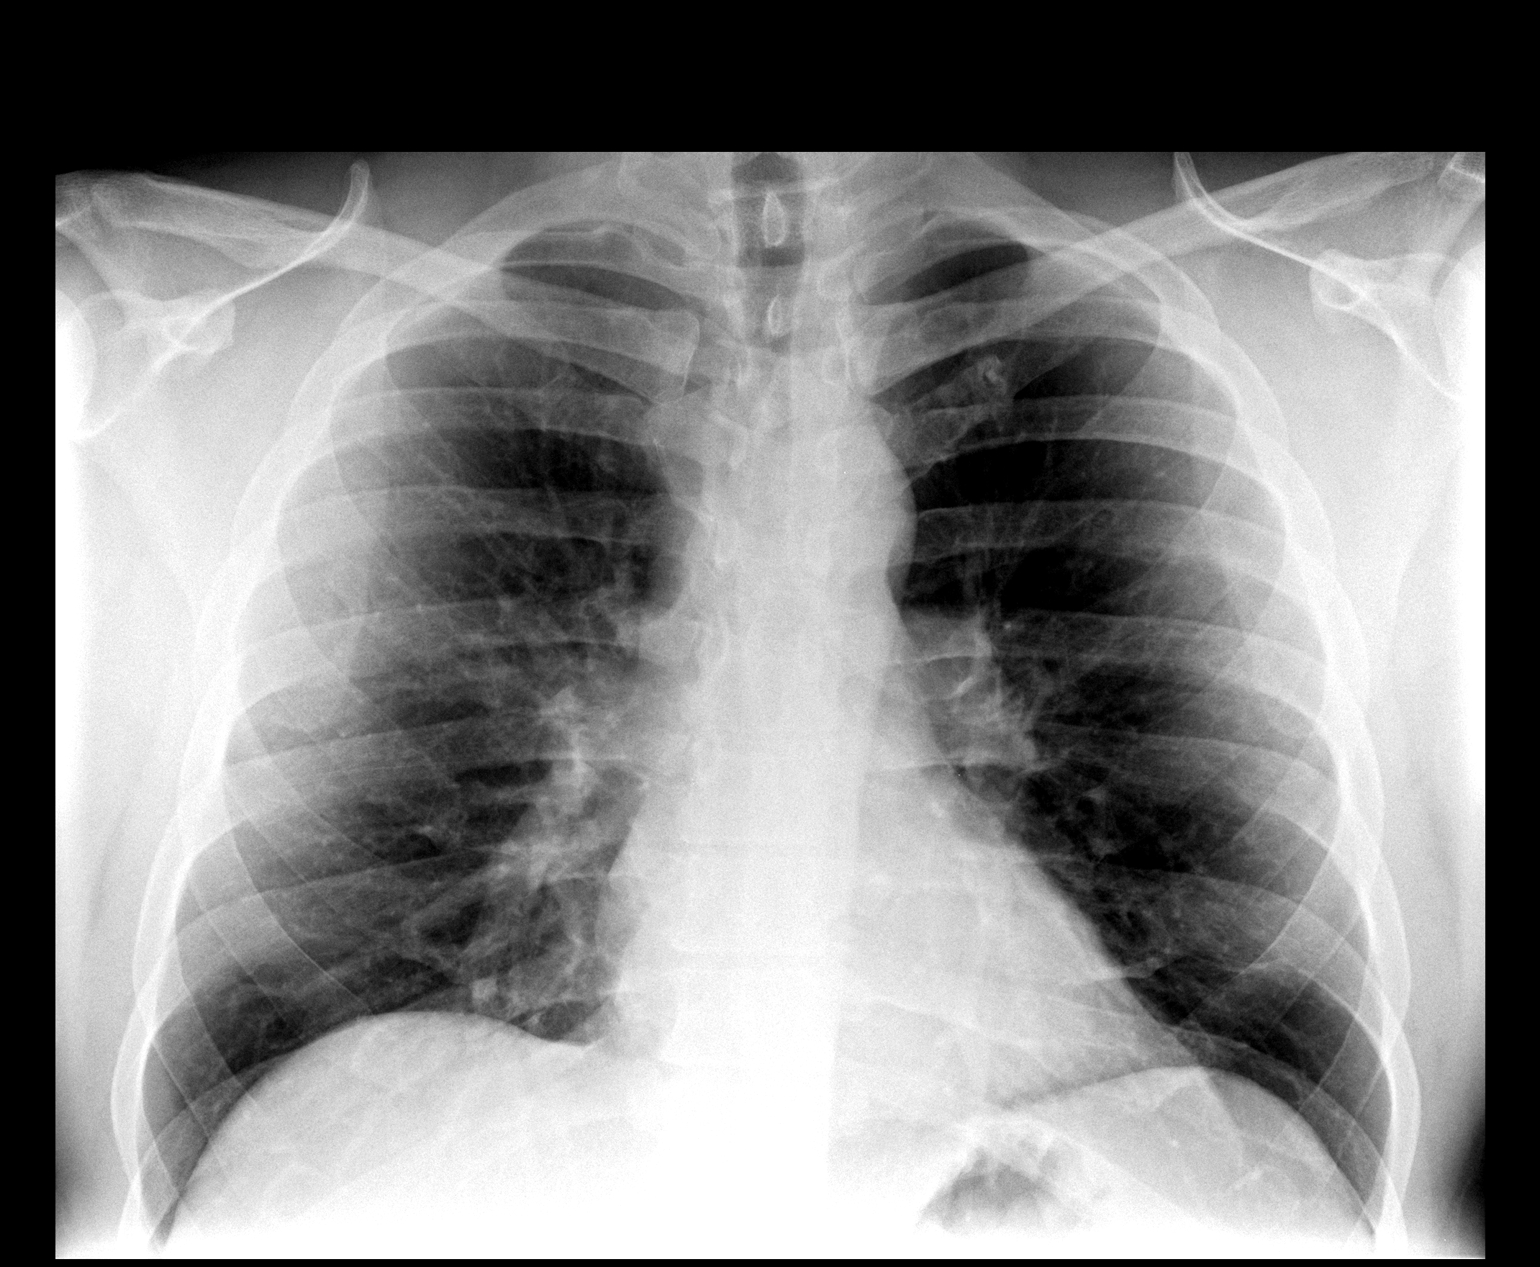

[view not recorded (2 of 2)]
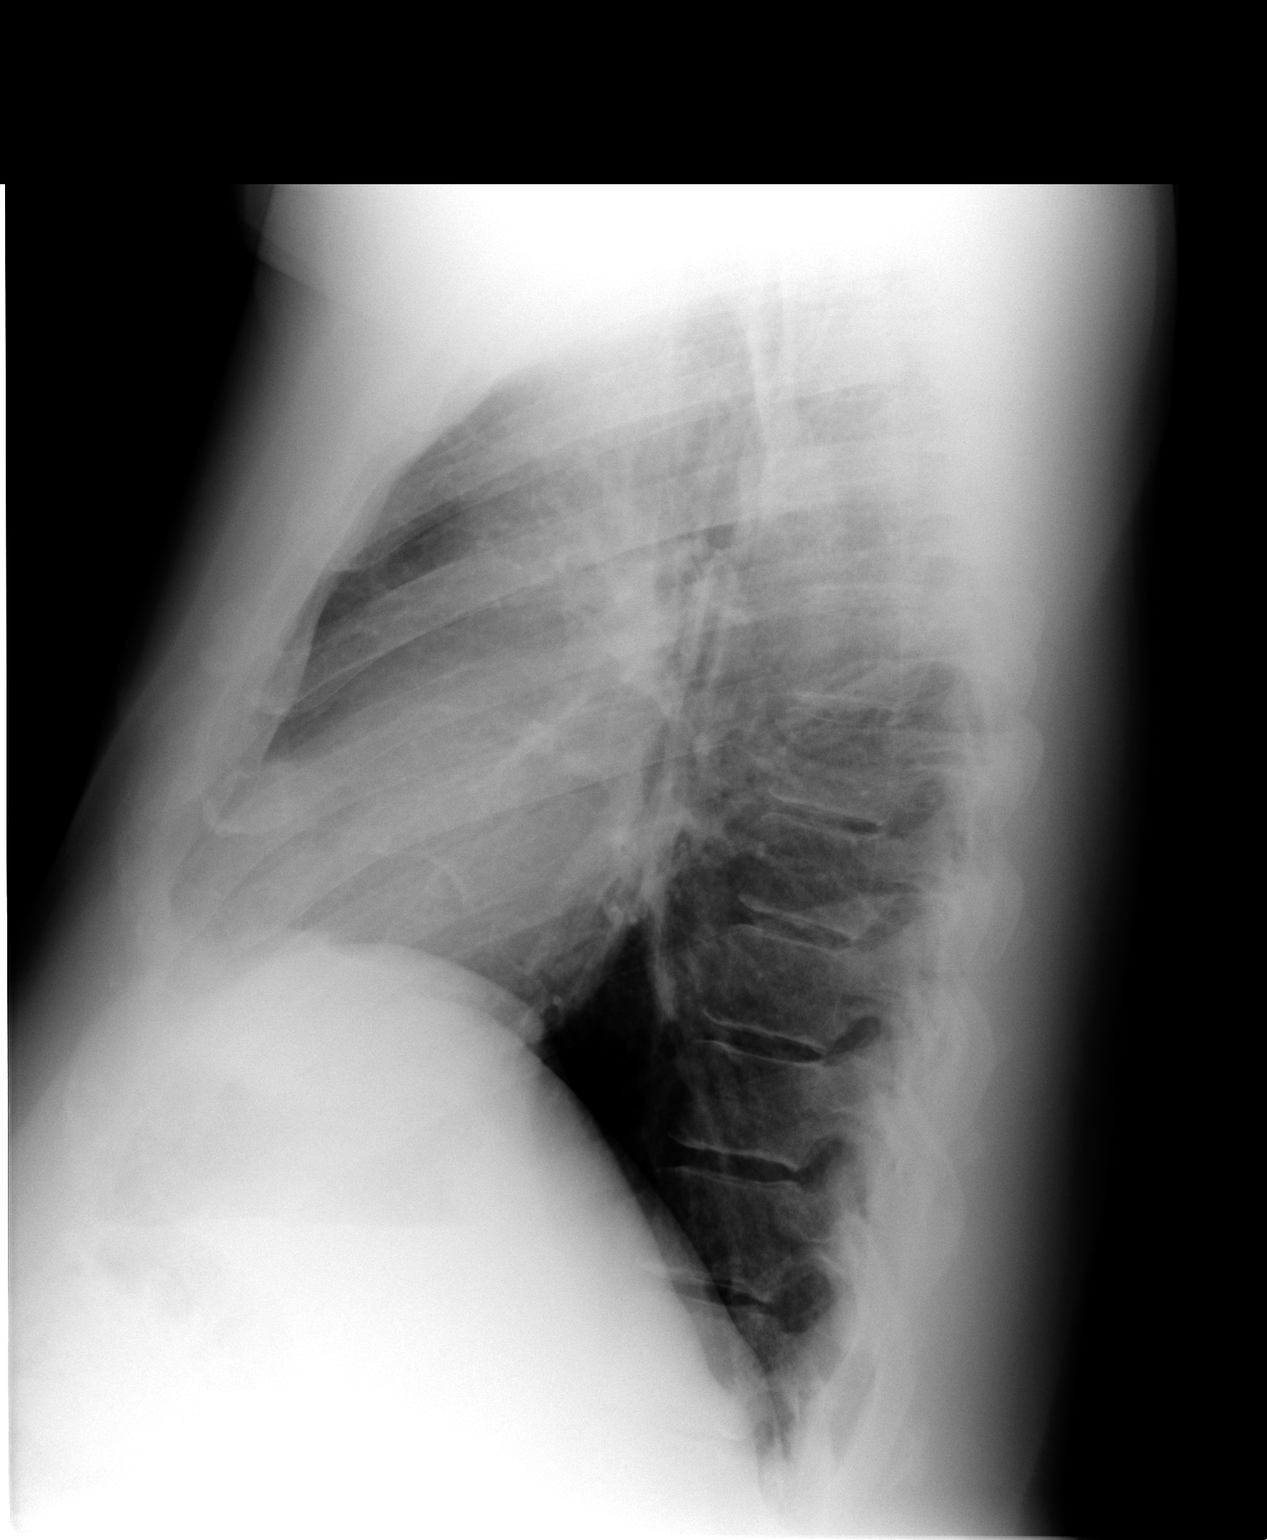

[2 of 2 positions shown; findings below may reference images not displayed]

FINDINGS: Heart and mediastinal contours are within normal limits.
No focal opacities or effusions.  No acute bony abnormality.
IMPRESSION: No active disease.

## 2012-03-25 ENCOUNTER — Emergency Department
Admission: EM | Admit: 2012-03-25 | Discharge: 2012-03-25 | Disposition: A | Discharge status: Home / Self Care | Payer: 59 | Source: Home / Self Care | Attending: Emergency Medicine | Admitting: Emergency Medicine

## 2012-03-25 ENCOUNTER — Encounter: Payer: Self-pay | Admitting: *Deleted

## 2012-03-25 DIAGNOSIS — J029 Acute pharyngitis, unspecified: Secondary | ICD-10-CM

## 2012-03-25 DIAGNOSIS — J069 Acute upper respiratory infection, unspecified: Secondary | ICD-10-CM

## 2012-03-25 HISTORY — DX: Other seasonal allergic rhinitis: J30.2

## 2012-03-25 MED ORDER — AZITHROMYCIN 250 MG PO TABS: ORAL_TABLET | ORAL | Status: AC

## 2012-03-25 MED ORDER — GUAIFENESIN-CODEINE 100-10 MG/5ML PO SYRP: 5.0000 mL | ORAL_SOLUTION | Freq: Four times a day (QID) | ORAL | Status: AC | PRN

## 2012-03-25 NOTE — ED Notes (Signed)
Pt c/o sore throat, head and chest congestion, and cough x 5 days. Denies fever. He has taken decongestants and advil with no relief.

## 2012-03-25 NOTE — ED Provider Notes (Addendum)
History     CSN: [id number]  Arrival date & time 03/25/12  4098   First MD Initiated Contact with Patient 03/25/12 [phone]      Chief Complaint  Patient presents with  . Sore Throat    (Consider location/radiation/quality/duration/timing/severity/associated sxs/prior treatment) HPI Keith Bradford is a 46 y.o. male who complains of onset of cold symptoms for 5 days.  The symptoms are constant and mild-moderate in severity.  Taking Advil which is helping a little.  No known sick contacts. + sore throat + cough (worsening) No pleuritic pain No wheezing + nasal congestion + post-nasal drainage + sinus pain/pressure No chest congestion No itchy/red eyes No earache No hemoptysis No SOB No chills/sweats No fever No nausea No vomiting No abdominal pain No diarrhea No skin rashes No fatigue No myalgias No headache    Past Medical History  Diagnosis Date  . Seasonal allergies     Past Surgical History  Procedure Date  . Cholecystectomy     History reviewed. No pertinent family history.  History  Substance Use Topics  . Smoking status: Never Smoker   . Smokeless tobacco: Not on file  . Alcohol Use: Yes      Review of Systems  All other systems reviewed and are negative.    Allergies  Review of patient's allergies indicates no known allergies.  Home Medications   Current Outpatient Rx  Name Route Sig Dispense Refill  . AZITHROMYCIN 250 MG PO TABS  Use as directed 1 each 0  . FEXOFENADINE HCL 180 MG PO TABS Oral Take 180 mg by mouth as needed.    . GUAIFENESIN-CODEINE 100-10 MG/5ML PO SYRP Oral Take 5 mLs by mouth 4 (four) times daily as needed for cough or congestion. 120 mL 0    BP 113/73  Pulse 76  Temp 98.4 F (36.9 C) (Oral)  Resp 18  Ht 5\' 11"  (1.803 m)  Wt 209 lb 8 oz (95.029 kg)  BMI 29.22 kg/m2  SpO2 99%  Physical Exam  Nursing note and vitals reviewed. Constitutional: He is oriented to person, place, and time. He appears well-developed and  well-nourished.  HENT:  Head: Normocephalic and atraumatic.  Right Ear: Tympanic membrane, external ear and ear canal normal.  Left Ear: Tympanic membrane, external ear and ear canal normal.  Nose: Mucosal edema present.  Mouth/Throat: Posterior oropharyngeal erythema (minimal) present. No oropharyngeal exudate or posterior oropharyngeal edema.       Cerumen bilaterally  Eyes: No scleral icterus.  Neck: Neck supple.  Cardiovascular: Regular rhythm and normal heart sounds.   Pulmonary/Chest: Effort normal and breath sounds normal. No respiratory distress. He has no decreased breath sounds. He has no wheezes. He has no rhonchi.  Neurological: He is alert and oriented to person, place, and time.  Skin: Skin is warm and dry.  Psychiatric: He has a normal mood and affect. His speech is normal.    ED Course  Procedures (including critical care time)   Labs Reviewed  POCT RAPID STREP A (OFFICE)   No results found.   1. Acute upper respiratory infections of unspecified site   2. Acute pharyngitis       MDM  1)  Take the prescribed antibiotic as instructed.  Hold for a few days since likely only viral.  Rx for cough syrup given to take. 2)  Use nasal saline solution (over the counter) at least 3 times a day. 3)  Use over the counter decongestants like Zyrtec-D every 12 hours as needed  to help with congestion.  If you have hypertension, do not take medicines with sudafed.  4)  Can take tylenol every 6 hours or motrin every 8 hours for pain or fever. 5)  Follow up with your primary doctor if no improvement in 5-7 days, sooner if increasing pain, fever, or new symptoms.      Marlaine Hind, MD 03/25/12 [phone]    Rapid strep negative.  No culture.  Marlaine Hind, MD 03/25/12 [phone]

## 2013-02-28 ENCOUNTER — Emergency Department
Admission: EM | Admit: 2013-02-28 | Discharge: 2013-02-28 | Disposition: A | Discharge status: Home / Self Care | Payer: 59 | Source: Home / Self Care | Attending: Family Medicine | Admitting: Family Medicine

## 2013-02-28 DIAGNOSIS — J309 Allergic rhinitis, unspecified: Secondary | ICD-10-CM

## 2013-02-28 DIAGNOSIS — J069 Acute upper respiratory infection, unspecified: Secondary | ICD-10-CM

## 2013-02-28 NOTE — ED Notes (Signed)
Keith Bradford complains of dry cough, sneezing, fevers, headaches, sore throat and runny nose for 2 days.

## 2013-02-28 NOTE — ED Provider Notes (Signed)
History     CSN: [id number]  Arrival date & time 02/28/13  1334   First MD Initiated Contact with Patient 02/28/13 1342      Chief Complaint  Patient presents with  . Cough    x 2 days  . Sore Throat    x 2 days  . sneezing     x 2 days   HPI  URI Symptoms Onset: 2 days  Description: rhinorrhea, nasal congestion, sinus pressure  Modifying factors:  Baseline allergies   Symptoms Nasal discharge: yes Fever: no Sore throat: no Cough: yes Wheezing: no Ear pain: no GI symptoms: no Sick contacts: no  Red Flags  Stiff neck: no Dyspnea: no Rash: no Swallowing difficulty: no  Sinusitis Risk Factors Headache/face pain: no Double sickening: no tooth pain: no  Allergy Risk Factors Sneezing: yes Itchy scratchy throat: yes Seasonal symptoms: yes  Flu Risk Factors Headache: no muscle aches: no severe fatigue: no   Past Medical History  Diagnosis Date  . Seasonal allergies     Past Surgical History  Procedure Laterality Date  . Cholecystectomy      Family History  Problem Relation Age of Onset  . Cancer Father     Throat    History  Substance Use Topics  . Smoking status: Never Smoker   . Smokeless tobacco: Never Used  . Alcohol Use: Yes      Review of Systems  All other systems reviewed and are negative.    Allergies  Review of patient's allergies indicates no known allergies.  Home Medications   Current Outpatient Rx  Name  Route  Sig  Dispense  Refill  . fexofenadine (ALLEGRA) 180 MG tablet   Oral   Take 180 mg by mouth as needed.           BP 127/83  Pulse 97  Temp(Src) 99 F (37.2 C) (Oral)  Ht 5\' 11"  (1.803 m)  Wt 228 lb (103.42 kg)  BMI 31.81 kg/m2  SpO2 96%  Physical Exam  Constitutional: He appears well-developed and well-nourished.  HENT:  Head: Normocephalic and atraumatic.  Right Ear: External ear normal.  Left Ear: External ear normal.  +nasal erythema, rhinorrhea bilaterally, + post oropharyngeal  erythema    Eyes: Pupils are equal, round, and reactive to light.  Neck: Normal range of motion. Neck supple.  Cardiovascular: Normal rate and regular rhythm.   Pulmonary/Chest: Effort normal and breath sounds normal.  Abdominal: Soft. Bowel sounds are normal.  Musculoskeletal: Normal range of motion.  Lymphadenopathy:    He has no cervical adenopathy.  Neurological: He is alert.  Skin: Skin is warm.    ED Course  Procedures (including critical care time)  Labs Reviewed - No data to display No results found.   1. URI (upper respiratory infection)   2. Allergic rhinitis       MDM  Overlapping viral and allergic sxs.  Discussed supportive care.  Restart nasal steroid and antihistamine.  Discussed infectious red flags including SOB.  Follow up as needed.      The patient and/or caregiver has been counseled thoroughly with regard to treatment plan and/or medications prescribed including dosage, schedule, interactions, rationale for use, and possible side effects and they verbalize understanding. Diagnoses and expected course of recovery discussed and will return if not improved as expected or if the condition worsens. Patient and/or caregiver verbalized understanding.             Doree Albee, MD 02/28/13 1352

## 2014-04-14 ENCOUNTER — Encounter (HOSPITAL_BASED_OUTPATIENT_CLINIC_OR_DEPARTMENT_OTHER): Payer: Self-pay | Admitting: Emergency Medicine

## 2014-04-14 ENCOUNTER — Emergency Department (HOSPITAL_BASED_OUTPATIENT_CLINIC_OR_DEPARTMENT_OTHER)
Admission: EM | Admit: 2014-04-14 | Discharge: 2014-04-14 | Disposition: A | Discharge status: Home / Self Care | Payer: 59 | Attending: Emergency Medicine | Admitting: Emergency Medicine

## 2014-04-14 ENCOUNTER — Emergency Department (HOSPITAL_BASED_OUTPATIENT_CLINIC_OR_DEPARTMENT_OTHER): Payer: 59

## 2014-04-14 DIAGNOSIS — R9431 Abnormal electrocardiogram [ECG] [EKG]: Secondary | ICD-10-CM | POA: Insufficient documentation

## 2014-04-14 DIAGNOSIS — Z79899 Other long term (current) drug therapy: Secondary | ICD-10-CM | POA: Insufficient documentation

## 2014-04-14 DIAGNOSIS — R079 Chest pain, unspecified: Secondary | ICD-10-CM | POA: Insufficient documentation

## 2014-04-14 LAB — CBC WITH DIFFERENTIAL/PLATELET
BASOS ABS: 0 10*3/uL (ref 0.0–0.1)
BASOS PCT: 0 % (ref 0–1)
Eosinophils Absolute: 0.1 10*3/uL (ref 0.0–0.7)
Eosinophils Relative: 3 % (ref 0–5)
HCT: 46.7 % (ref 39.0–52.0)
Hemoglobin: 16.7 g/dL (ref 13.0–17.0)
LYMPHS PCT: 33 % (ref 12–46)
Lymphs Abs: 1.5 10*3/uL (ref 0.7–4.0)
MCH: 30.8 pg (ref 26.0–34.0)
MCHC: 35.8 g/dL (ref 30.0–36.0)
MCV: 86 fL (ref 78.0–100.0)
Monocytes Absolute: 0.4 10*3/uL (ref 0.1–1.0)
Monocytes Relative: 9 % (ref 3–12)
NEUTROS ABS: 2.6 10*3/uL (ref 1.7–7.7)
NEUTROS PCT: 55 % (ref 43–77)
PLATELETS: 140 10*3/uL — AB (ref 150–400)
RBC: 5.43 MIL/uL (ref 4.22–5.81)
RDW: 13.1 % (ref 11.5–15.5)
WBC: 4.7 10*3/uL (ref 4.0–10.5)

## 2014-04-14 LAB — TROPONIN I: Troponin I: <0.3 ng/mL (ref <0.30)

## 2014-04-14 LAB — BASIC METABOLIC PANEL
ANION GAP: 13 (ref 5–15)
BUN: 17 mg/dL (ref 6–23)
CHLORIDE: 102 meq/L (ref 96–112)
CO2: 27 meq/L (ref 19–32)
Calcium: 9.6 mg/dL (ref 8.4–10.5)
Creatinine, Ser: 1.1 mg/dL (ref 0.50–1.35)
GFR calc non Af Amer: 79 mL/min — ABNORMAL LOW (ref >90)
Glucose, Bld: 106 mg/dL — ABNORMAL HIGH (ref 70–99)
Potassium: 3.9 mEq/L (ref 3.7–5.3)
SODIUM: 142 meq/L (ref 137–147)

## 2014-04-14 MED ORDER — ASPIRIN 81 MG PO CHEW
324.0000 mg | CHEWABLE_TABLET | Freq: Once | ORAL | Status: AC
  Administered 2014-04-14: 324 mg via ORAL
  Filled 2014-04-14: qty 4

## 2014-04-14 NOTE — ED Notes (Signed)
Patient transported to X-ray 

## 2014-04-14 NOTE — ED Notes (Signed)
Pt having left sided chest pain.  No other symptoms.  Started prior to lunch.

## 2014-04-14 NOTE — Discharge Instructions (Signed)
Call for a follow up appointment with a Family or Primary Care Provider.  Call a cardiologist for further evaluation of your chest pain and abnormal EKG/ECG findings. Return if Symptoms worsen.   Take medication as prescribed.

## 2014-04-14 NOTE — ED Provider Notes (Signed)
CSN: [id number]     Arrival date & time 04/14/14  1312 History   First MD Initiated Contact with Patient 04/14/14 1356     Chief Complaint  Patient presents with  . Chest Pain     (Consider location/radiation/quality/duration/timing/severity/associated sxs/prior Treatment) HPI Comments: The patient is a 48 year old male without significant past medical history presenting to emergency room chief complaint of left-sided chest pain with an abrupt onset approximately 11:45. The patient describes the left-sided discomfort as nonradiating, chest tightness, mild, onset after eating. The patient states discomfort self resolved approximately one hour ago, denying chest pain in the emergency department. Denies similar symptoms in the past. Denies aggravating or relieving factors, denies taking anything for discomfort.  The patient denies cough, dyspnea, leg swelling, palpitations, pleurisy.  Denies history of murmur, previous MI, arrythmia, or family history of early MI.  NO previous stress test. PCP: [facility]  The history is provided by the patient. No language interpreter was used.    Past Medical History  Diagnosis Date  . Seasonal allergies    Past Surgical History  Procedure Laterality Date  . Cholecystectomy     Family History  Problem Relation Age of Onset  . Cancer Father     Throat   History  Substance Use Topics  . Smoking status: Never Smoker   . Smokeless tobacco: Never Used  . Alcohol Use: Yes    Review of Systems  Constitutional: Negative for fever and chills.  Respiratory: Positive for chest tightness. Negative for cough and shortness of breath.   Cardiovascular: Positive for chest pain. Negative for palpitations and leg swelling.  Gastrointestinal: Negative for nausea, vomiting and abdominal pain.      Allergies  Review of patient's allergies indicates no known allergies.  Home Medications   Prior to Admission medications   Medication Sig Start Date End Date  Taking? Authorizing Provider  fexofenadine (ALLEGRA) 180 MG tablet Take 180 mg by mouth as needed.    Historical Provider, MD   BP 125/86  Pulse 76  Temp(Src) 98.7 F (37.1 C) (Oral)  Resp 16  SpO2 100% Physical Exam  Nursing note and vitals reviewed. Constitutional: He is oriented to person, place, and time. He appears well-developed and well-nourished.  Non-toxic appearance. He does not have a sickly appearance. He does not appear ill. No distress.  HENT:  Head: Normocephalic and atraumatic.  Mouth/Throat: Oropharynx is clear and moist.  Eyes: EOM are normal. Pupils are equal, round, and reactive to light. No scleral icterus.  Neck: Normal range of motion. Neck supple.  Cardiovascular: Normal rate and regular rhythm.   No lower extremity edema  Pulmonary/Chest: Effort normal and breath sounds normal. No respiratory distress. He has no wheezes. He has no rales. He exhibits no tenderness.  No rash to anterior chest  Abdominal: Soft. He exhibits no distension. There is no tenderness. There is no rebound, no guarding and no CVA tenderness.  Musculoskeletal: Normal range of motion.  Neurological: He is alert and oriented to person, place, and time.  Skin: Skin is warm and dry. He is not diaphoretic.  Psychiatric: He has a normal mood and affect. His behavior is normal.    ED Course  Procedures (including critical care time) Labs Review Results for orders placed during the hospital encounter of 04/14/14  CBC WITH DIFFERENTIAL      Result Value Ref Range   WBC 4.7  4.0 - 10.5 K/uL   RBC 5.43  4.22 - 5.81 MIL/uL   Hemoglobin  16.7  13.0 - 17.0 g/dL   HCT 46.7  39.0 - 52.0 %   MCV 86.0  78.0 - 100.0 fL   MCH 30.8  26.0 - 34.0 pg   MCHC 35.8  30.0 - 36.0 g/dL   RDW 13.1  11.5 - 15.5 %   Platelets 140 (*) 150 - 400 K/uL   Neutrophils Relative % 55  43 - 77 %   Neutro Abs 2.6  1.7 - 7.7 K/uL   Lymphocytes Relative 33  12 - 46 %   Lymphs Abs 1.5  0.7 - 4.0 K/uL   Monocytes Relative  9  3 - 12 %   Monocytes Absolute 0.4  0.1 - 1.0 K/uL   Eosinophils Relative 3  0 - 5 %   Eosinophils Absolute 0.1  0.0 - 0.7 K/uL   Basophils Relative 0  0 - 1 %   Basophils Absolute 0.0  0.0 - 0.1 K/uL  BASIC METABOLIC PANEL      Result Value Ref Range   Sodium 142  137 - 147 mEq/L   Potassium 3.9  3.7 - 5.3 mEq/L   Chloride 102  96 - 112 mEq/L   CO2 27  19 - 32 mEq/L   Glucose, Bld 106 (*) 70 - 99 mg/dL   BUN 17  6 - 23 mg/dL   Creatinine, Ser 1.10  0.50 - 1.35 mg/dL   Calcium 9.6  8.4 - 10.5 mg/dL   GFR calc non Af Amer 79 (*) >90 mL/min   GFR calc Af Amer >90  >90 mL/min   Anion gap 13  5 - 15  TROPONIN I      Result Value Ref Range   Troponin I <0.30  <0.30 ng/mL  TROPONIN I      Result Value Ref Range   Troponin I <0.30  <0.30 ng/mL   Dg Chest 2 View  04/14/2014   CLINICAL DATA:  Chest pain.  EXAM: CHEST  2 VIEW  COMPARISON:  PA and lateral chest 02/28/2011.  FINDINGS: Lungs are clear. Heart size is normal. No pneumothorax or pleural effusion. No bony abnormality is identified.  IMPRESSION: Negative chest.   Electronically Signed   By: Inge Rise M.D.   On: 04/14/2014 14:14    Date: 04/14/2014  Rate: 83    Rhythm: normal sinus rhythm  QRS Axis: left  Intervals: normal  ST/T Wave abnormalities: normal  Conduction Disutrbances:none  Narrative Interpretation:   Old EKG Reviewed: none available    MDM   Final diagnoses:  Chest pain, unspecified chest pain type  Abnormal ECG   Patient presents with atypical chest pain, low heart score for major cardiac events. EKG shows left axis deviation. Chest pain-free in ED.  Reevaluation patient denies further episodes of chest pain while in ED. Discussed work up and repeat troponin. 1730 Reevaluation patient resting comfortably in room denies further chest pain. Negative troponin x2 low risk of ACS. patient followup with PCP and cardiologist for abnormal EKG. Discussed lab results, imaging results, and treatment plan  with the patient and patient's wife. Return precautions given. Reports understanding and no other concerns at this time.  Patient is stable for discharge at this time.  Meds given in ED:  Medications  aspirin chewable tablet 324 mg (324 mg Oral Given 04/14/14 1434)    New Prescriptions   No medications on file        Lorrine Kin, PA-C 04/14/14 2130

## 2014-04-18 NOTE — ED Provider Notes (Signed)
Medical screening examination/treatment/procedure(s) were performed by non-physician practitioner and as supervising physician I was immediately available for consultation/collaboration.   EKG Interpretation   Date/Time:  Thursday April 14 2014 13:24:26 EDT Ventricular Rate:  83 PR Interval:  148 QRS Duration: 86 QT Interval:  346 QTC Calculation: 406 R Axis:   -33 Text Interpretation:  Normal sinus rhythm Left axis deviation Abnormal ECG  ED PHYSICIAN INTERPRETATION AVAILABLE IN [facility] Confirmed by  TEST, Record ([id number]) on 04/16/2014 9:33:20 AM        Orpah Greek, MD 04/18/14 [phone]

## 2014-04-19 ENCOUNTER — Encounter: Payer: Self-pay | Admitting: Cardiovascular Disease

## 2014-04-19 ENCOUNTER — Encounter: Payer: Self-pay | Admitting: *Deleted

## 2014-04-19 ENCOUNTER — Ambulatory Visit (INDEPENDENT_AMBULATORY_CARE_PROVIDER_SITE_OTHER): Payer: 59 | Admitting: Cardiovascular Disease

## 2014-04-19 VITALS — BP 100/86 | HR 70 | Ht 71.0 in | Wt 220.8 lb

## 2014-04-19 DIAGNOSIS — R079 Chest pain, unspecified: Secondary | ICD-10-CM

## 2014-04-19 NOTE — Patient Instructions (Addendum)

## 2014-04-19 NOTE — Assessment & Plan Note (Signed)
Chest pain with limited risk factors  R/O in ER  F/U ETT

## 2014-04-19 NOTE — Progress Notes (Signed)
Patient ID: Keith Bradford, male   DOB: 17-Jun-1966, 48 y.o.   MRN: [id number]   48 year old male without significant past medical history seen in ER 04/14/14 chief complaint of left-sided chest pain with an abrupt onset approximately 11:45. The patient describes the left-sided discomfort as nonradiating, chest tightness, mild, onset after eating. The patient states discomfort self resolved approximately one hour ago, denying chest pain in the emergency department. Denies similar symptoms in the past. Denies aggravating or relieving factors, denies taking anything for discomfort. The patient denies cough, dyspnea, leg swelling, palpitations, pleurisy. Denies history of murmur, previous MI, arrythmia, or family history of early MI. NO previous stress test.  In ER CXR normal ECG normal except LAD and negative cardiac markers  Labs otherwise significant for low PLTls    D/C home for outpatient f/u  Nondiabetic non smoker Occasional ETOH.  No high risk family  History Pain has not recurred since d/c      ROS: Denies fever, malais, weight loss, blurry vision, decreased visual acuity, cough, sputum, SOB, hemoptysis, pleuritic pain, palpitaitons, heartburn, abdominal pain, melena, lower extremity edema, claudication, or rash.  All other systems reviewed and negative   General: Affect appropriate Healthy:  appears stated age 61: normal Neck supple with no adenopathy JVP normal no bruits no thyromegaly Lungs clear with no wheezing and good diaphragmatic motion Heart:  S1/S2 no murmur,rub, gallop or click PMI normal Abdomen: benighn, BS positve, no tenderness, no AAA no bruit.  No HSM or HJR Distal pulses intact with no bruits No edema Neuro non-focal Skin warm and dry No muscular weakness  Medications Current Outpatient Prescriptions  Medication Sig Dispense Refill  . fexofenadine (ALLEGRA) 180 MG tablet Take 180 mg by mouth as needed.       No current facility-administered medications for  this visit.    Allergies Review of patient's allergies indicates no known allergies.  Family History: Family History  Problem Relation Age of Onset  . Cancer Father     Throat    Social History: History   Social History  . Marital Status: Married    Spouse Name: N/A    Number of Children: N/A  . Years of Education: N/A   Occupational History  . Not on file.   Social History Main Topics  . Smoking status: Never Smoker   . Smokeless tobacco: Never Used  . Alcohol Use: Yes  . Drug Use: No  . Sexual Activity: Not on file   Other Topics Concern  . Not on file   Social History Narrative  . No narrative on file    Electrocardiogram:  SR LAD otherwise normal rate 89   Assessment and Plan

## 2014-05-12 ENCOUNTER — Telehealth (HOSPITAL_COMMUNITY): Payer: Self-pay

## 2014-05-12 NOTE — Telephone Encounter (Signed)
Encounter complete. 

## 2014-05-17 ENCOUNTER — Encounter (HOSPITAL_COMMUNITY): Payer: 59

## 2014-05-20 ENCOUNTER — Telehealth (HOSPITAL_COMMUNITY): Payer: Self-pay | Admitting: *Deleted

## 2014-07-05 ENCOUNTER — Encounter (HOSPITAL_COMMUNITY): Payer: Self-pay | Admitting: *Deleted

## 2014-07-06 ENCOUNTER — Encounter (HOSPITAL_COMMUNITY): Payer: Self-pay | Admitting: *Deleted

## 2014-07-15 ENCOUNTER — Telehealth (HOSPITAL_COMMUNITY): Payer: Self-pay

## 2014-07-15 NOTE — Telephone Encounter (Signed)
Encounter complete. 

## 2014-07-20 ENCOUNTER — Ambulatory Visit (HOSPITAL_COMMUNITY)
Admission: RE | Admit: 2014-07-20 | Discharge: 2014-07-20 | Disposition: A | Discharge status: Home / Self Care | Payer: 59 | Source: Ambulatory Visit | Attending: Cardiology | Admitting: Cardiology

## 2014-07-20 DIAGNOSIS — R079 Chest pain, unspecified: Secondary | ICD-10-CM | POA: Diagnosis present

## 2014-07-20 NOTE — Procedures (Signed)
Exercise Treadmill Test  Pre-Exercise Testing Evaluation NSR  Test  Exercise Tolerance Test Ordering MD: Jenkins Rouge, MD  Interpreting MD:   Unique Test No:1  Treadmill:  1  Indication for ETT: chest pain - rule out ischemia  Contraindication to ETT: No   Stress Modality: exercise - treadmill  Cardiac Imaging Performed: non   Protocol: standard Bruce - maximal  Max BP:  171/78  Max MPHR (bpm): 174 85% MPR (bpm):  147  MPHR obtained (bpm):  151 % MPHR obtained:  86  Reached 85% MPHR (min:sec):7:45 Total Exercise Time (min-sec):  8:02  Workload in METS:  10.10 Borg Scale:   Reason ETT Terminated:  leg cramps, fatigue    ST Segment Analysis At Rest: normal ST segments - no evidence of significant ST depression With Exercise: no evidence of significant ST depression  Other Information Arrhythmia:  No Angina during ETT:  absent (0) Quality of ETT:  diagnostic  ETT Interpretation:  normal - no evidence of ischemia by ST analysis  Comments: Normotensive response to exercise. Fair exercise tolerance. Study was stopped due to "calf muscle tightness".  Recommendations: Evaluation PAD may be indicated, depending on history/physical exam findings.  Sanda Klein, MD, [facility] [phone] office [phone] pager

## 2017-01-01 DIAGNOSIS — J029 Acute pharyngitis, unspecified: Secondary | ICD-10-CM | POA: Diagnosis not present

## 2017-03-25 DIAGNOSIS — L821 Other seborrheic keratosis: Secondary | ICD-10-CM | POA: Diagnosis not present

## 2017-03-25 DIAGNOSIS — D2361 Other benign neoplasm of skin of right upper limb, including shoulder: Secondary | ICD-10-CM | POA: Diagnosis not present

## 2017-03-25 DIAGNOSIS — D1800 Hemangioma unspecified site: Secondary | ICD-10-CM | POA: Diagnosis not present

## 2019-10-11 ENCOUNTER — Other Ambulatory Visit: Payer: Self-pay

## 2019-10-11 ENCOUNTER — Ambulatory Visit: Payer: 59 | Attending: Internal Medicine

## 2019-10-11 DIAGNOSIS — Z20822 Contact with and (suspected) exposure to covid-19: Secondary | ICD-10-CM

## 2019-10-12 LAB — NOVEL CORONAVIRUS, NAA: SARS-CoV-2, NAA: DETECTED — AB
